# Patient Record
Sex: Female | Born: 1990 | Race: White | Hispanic: No | Marital: Single | Smoking: Never smoker
Health system: Southern US, Community
[De-identification: ages and names within clinical notes are randomized; demographics above are authoritative.]

## PROBLEM LIST (undated history)

## (undated) ENCOUNTER — Inpatient Hospital Stay (HOSPITAL_COMMUNITY): Payer: Self-pay

## (undated) DIAGNOSIS — R87619 Unspecified abnormal cytological findings in specimens from cervix uteri: Secondary | ICD-10-CM

## (undated) DIAGNOSIS — IMO0002 Reserved for concepts with insufficient information to code with codable children: Secondary | ICD-10-CM

## (undated) DIAGNOSIS — B999 Unspecified infectious disease: Secondary | ICD-10-CM

## (undated) DIAGNOSIS — A630 Anogenital (venereal) warts: Secondary | ICD-10-CM

## (undated) DIAGNOSIS — Z8619 Personal history of other infectious and parasitic diseases: Secondary | ICD-10-CM

## (undated) HISTORY — DX: Unspecified abnormal cytological findings in specimens from cervix uteri: R87.619

## (undated) HISTORY — DX: Personal history of other infectious and parasitic diseases: Z86.19

## (undated) HISTORY — PX: OTHER SURGICAL HISTORY: SHX169

## (undated) HISTORY — DX: Reserved for concepts with insufficient information to code with codable children: IMO0002

## (undated) HISTORY — DX: Anogenital (venereal) warts: A63.0

---

## 2012-02-09 ENCOUNTER — Inpatient Hospital Stay (HOSPITAL_COMMUNITY)
Admission: AD | Admit: 2012-02-09 | Discharge: 2012-02-10 | Disposition: A | Discharge status: Home / Self Care | Payer: 59 | Source: Ambulatory Visit | Attending: Obstetrics & Gynecology | Admitting: Obstetrics & Gynecology

## 2012-02-09 ENCOUNTER — Ambulatory Visit (INDEPENDENT_AMBULATORY_CARE_PROVIDER_SITE_OTHER): Payer: 59 | Admitting: Emergency Medicine

## 2012-02-09 ENCOUNTER — Inpatient Hospital Stay (HOSPITAL_COMMUNITY): Payer: 59

## 2012-02-09 ENCOUNTER — Encounter (HOSPITAL_COMMUNITY): Payer: Self-pay | Admitting: *Deleted

## 2012-02-09 VITALS — BP 115/75 | HR 88 | Temp 98.0°F | Resp 16 | Ht 62.0 in | Wt 115.8 lb

## 2012-02-09 DIAGNOSIS — R109 Unspecified abdominal pain: Secondary | ICD-10-CM

## 2012-02-09 DIAGNOSIS — O26899 Other specified pregnancy related conditions, unspecified trimester: Secondary | ICD-10-CM

## 2012-02-09 DIAGNOSIS — O99891 Other specified diseases and conditions complicating pregnancy: Secondary | ICD-10-CM | POA: Insufficient documentation

## 2012-02-09 DIAGNOSIS — N949 Unspecified condition associated with female genital organs and menstrual cycle: Secondary | ICD-10-CM

## 2012-02-09 DIAGNOSIS — O9989 Other specified diseases and conditions complicating pregnancy, childbirth and the puerperium: Secondary | ICD-10-CM

## 2012-02-09 LAB — WET PREP, GENITAL: Trich, Wet Prep: NONE SEEN

## 2012-02-09 LAB — CBC
MCV: 90.8 fL (ref 78.0–100.0)
Platelets: 170 10*3/uL (ref 150–400)
RDW: 13.1 % (ref 11.5–15.5)
WBC: 5.5 10*3/uL (ref 4.0–10.5)

## 2012-02-09 LAB — POCT URINALYSIS DIPSTICK
Blood, UA: NEGATIVE
Leukocytes, UA: NEGATIVE
Spec Grav, UA: 1.02
pH, UA: 7.5

## 2012-02-09 LAB — POCT UA - MICROSCOPIC ONLY
Bacteria, U Microscopic: NEGATIVE
Casts, Ur, LPF, POC: NEGATIVE
Crystals, Ur, HPF, POC: NEGATIVE

## 2012-02-09 LAB — ABO/RH: ABO/RH(D): AB POS

## 2012-02-09 MED ORDER — CONCEPT OB 130-92.4-1 MG PO CAPS: 1.0000 | ORAL_CAPSULE | Freq: Every day | ORAL | Status: DC

## 2012-02-09 NOTE — Patient Instructions (Addendum)
Driving directions to [facility] 3D2D  [phone]  - more info    [address]  [place], [place] [address]     1. Head south on [address] toward [address]      0.5 mi    2. Sharp left onto [address]      0.6 mi    3. Turn left onto the [address] ramp      0.2 mi    4. Merge onto [address]      3.0 mi    5. Continue straight to stay on [address]      0.4 mi    6. Slight left to stay on [address]      1.2 mi    7. Turn right onto [address]      0.1 mi    8. Turn left onto [address]      361 ft    9. Take the 1st left onto [address]  Destination will be on the right    Driving directions to [facility] 3D2D  [phone]  - more info    [address]  [place], [place] [address]     1. Head north on [address] toward [address]      344 ft    2. Turn right onto [address]      0.3 mi    3. Slight left to stay on [address]      1.7 mi    4. Turn left onto [address]  Destination will be on the right     0.6 mi      Hospital  [address]   Driving directions to [address], [place], [place] [address] 3D2D  - more info    [address]  [place], [place] [address]     1. Head south on [address] toward [address]      0.5 mi    2. Sharp left onto [address]      0.6 mi    3. Turn left onto the [address] ramp      0.2 mi    4. Merge onto [address]      3.0 mi    5. Continue straight to stay on [address]      0.4 mi    6. Slight left to stay on [address]  Destination will be on the right     1.0 mi     [address]  [place], [place] [address]   Driving directions to [facility] 3D2D  [phone]  - more info    [address]  [place], [place] [address]     1. Head south on [address] toward [address]      0.5 mi    2. Sharp left onto [address]      0.6 mi    3. Turn left onto the [address] ramp      0.2 mi    4. Merge onto  [address]      3.0 mi    5. Slight right toward [address] (signs for [address])      0.2 mi    6. Take the ramp to [address]      338 ft    7. Turn left onto [address]      0.3 mi    8. Turn left onto [address]  Destination will be on the right     0.2 mi     [facility]  [address]   Driving directions to [facility] 3D2D  - more info    [address]  [place], [place] [address]     1. Head south on [address] toward [address]      0.7 mi    2. Turn left onto [address]      0.4 mi    3. Take the 3rd right onto [address]      1.1 mi    4. Take the [address] ramp to [place]      0.2 mi    5. Merge onto [address]      3.7 mi    6. Take [address] toward [place]      0.3 mi    7. Keep left at the fork, follow signs for [address]      381 ft    8. Keep left at the fork, follow signs for [address]      302 ft    9. Turn left onto [address]      2.6 mi    10. Turn right onto [address]  Destination will be on the left     0.2 mi     [facility]    

## 2012-02-09 NOTE — MAU Note (Signed)
I'm pregnant and been having cramping last 4-5 days. Seen at Urgent Care couple hrs ago and found out I'm pregnant.  Told to come here for u/s to be sure is not ectopic. No spotting and no pain now.

## 2012-02-09 NOTE — MAU Provider Note (Signed)
Chief Complaint: Vaginal Bleeding   First Provider Initiated Contact with Patient 02/09/12 2151     SUBJECTIVE HPI: Debbie Soto is a 21 y.o. G1P0 at3.5 weeks by uncertain, early LMP who presents with abd cramping x 4-5 days. She was seen at Urgent Care this afternoon and found to have pos UPT. Instructed to come to MAU or ED to R/O ectopic or SAB. Denies bleeding, urinary complaints, GI complaints, fever, chills, dyspareunia or vaginal discharge. Last bowel movement yesterday. Normal UA at urgent care. Pelvic exam not done due to patient preference for female provider.  No past medical history on file. OB History    Grav Para Term Preterm Abortions TAB SAB Ect Mult Living   1              # Outc Date GA Lbr Len/2nd Wgt Sex Del Anes PTL Lv   1 CUR              No past surgical history on file. History   Social History  . Marital Status: Single    Spouse Name: N/A    Number of Children: N/A  . Years of Education: N/A   Occupational History  . Not on file.   Social History Main Topics  . Smoking status: Current Some Day Smoker  . Smokeless tobacco: Not on file  . Alcohol Use: Not on file  . Drug Use: Not on file  . Sexually Active: Yes   Other Topics Concern  . Not on file   Social History Narrative  . No narrative on file   No current facility-administered medications on file prior to encounter.   No current outpatient prescriptions on file prior to encounter.   No Known Allergies  ROS: Pertinent items in HPI  OBJECTIVE Blood pressure 120/79, pulse 117, temperature 98.1 F (36.7 C), resp. rate 20, height 5\' 2"  (1.575 m), weight 52.164 kg (115 lb), last menstrual period 01/14/2012, SpO2 100.00%. Patient Vitals for the past 24 hrs:  BP Temp Pulse Resp SpO2 Height Weight  02/09/12 2335 119/69 mmHg - 107  16  - - -  02/09/12 2127 120/79 mmHg 98.1 F (36.7 C) 117  20  100 % 5\' 2"  (1.575 m) 52.164 kg (115 lb)    GENERAL: Well-developed, well-nourished female in  no acute distress. Very anxious. HEENT: Normocephalic HEART: tachycardia RESP: normal effort ABDOMEN: Soft, mild bilat mid-abd tenderness EXTREMITIES: Nontender, no edema NEURO: Alert and oriented SPECULUM EXAM: NEFG, physiologic discharge, , normal odor, no blood noted, cervix clean, non-friable BIMANUAL: cervix closed; uterus, retroverted, normal size, no adnexal tenderness or masses, no CMT  LAB RESULTS Results for orders placed during the hospital encounter of 02/09/12 (from the past 24 hour(s))  POCT PREGNANCY, URINE     Status: Abnormal   Collection Time   02/09/12  9:27 PM      Component Value Range   Preg Test, Ur POSITIVE (*) NEGATIVE  WET PREP, GENITAL     Status: Abnormal   Collection Time   02/09/12 10:08 PM      Component Value Range   Yeast Wet Prep HPF POC NONE SEEN  NONE SEEN   Trich, Wet Prep NONE SEEN  NONE SEEN   Clue Cells Wet Prep HPF POC FEW (*) NONE SEEN   WBC, Wet Prep HPF POC FEW (*) NONE SEEN  CBC     Status: Abnormal   Collection Time   02/09/12 10:15 PM      Component Value Range  WBC 5.5  4.0 - 10.5 K/uL   RBC 3.93  3.87 - 5.11 MIL/uL   Hemoglobin 11.9 (*) 12.0 - 15.0 g/dL   HCT 16.1 (*) 09.6 - 04.5 %   MCV 90.8  78.0 - 100.0 fL   MCH 30.3  26.0 - 34.0 pg   MCHC 33.3  30.0 - 36.0 g/dL   RDW 40.9  81.1 - 91.4 %   Platelets 170  150 - 400 K/uL  HCG, QUANTITATIVE, PREGNANCY     Status: Abnormal   Collection Time   02/09/12 10:16 PM      Component Value Range   hCG, Beta Chain, Quant, S 4443 (*) <5 mIU/mL  ABO/RH     Status: Normal (Preliminary result)   Collection Time   02/09/12 10:16 PM      Component Value Range   ABO/RH(D) AB POS      IMAGING US Ob Comp Less 14 Wks  02/09/2012  *RADIOLOGY REPORT*  Clinical Data: Abdominal pain and cramping.  OBSTETRIC <14 WK [place] AND TRANSVAGINAL OB US  Technique:  Both transabdominal and transvaginal ultrasound examinations were performed for complete evaluation of the gestation as well as the maternal  uterus, adnexal regions, and pelvic cul-de-sac.  Transvaginal technique was performed to assess early pregnancy.  Comparison:  None.  Intrauterine gestational sac:  Visualized/normal in shape. Yolk sac: No Embryo: No Cardiac Activity: N/A  MSD: 5.4 mm  5 w 1 d          [place] EDC: 10/10/2012  Maternal uterus/adnexae: No subchorionic hemorrhage is noted.  The uterus is unremarkable in appearance.  The ovaries are within normal limits.  The right ovary measures 3.0 x 1.8 x 2.1 cm, while the left ovary measures 2.9 x 2.1 x 2.7 cm. No suspicious adnexal masses are seen; there is no evidence for ovarian torsion.  No free fluid is seen within the pelvic cul-de-sac.  IMPRESSION: Single intrauterine gestational sac noted, with a mean sac diameter of 5.4 mm, corresponding to a gestational age of [redacted] weeks 1 day. This does not match the gestational age by LMP, reflecting a new estimated date of delivery of October 10, 2012.   Original Report Authenticated By: Tonia Ghent, M.D.    US Ob Transvaginal  02/09/2012  *RADIOLOGY REPORT*  Clinical Data: Abdominal pain and cramping.  OBSTETRIC <14 WK [place] AND TRANSVAGINAL OB US  Technique:  Both transabdominal and transvaginal ultrasound examinations were performed for complete evaluation of the gestation as well as the maternal uterus, adnexal regions, and pelvic cul-de-sac.  Transvaginal technique was performed to assess early pregnancy.  Comparison:  None.  Intrauterine gestational sac:  Visualized/normal in shape. Yolk sac: No Embryo: No Cardiac Activity: N/A  MSD: 5.4 mm  5 w 1 d          [place] EDC: 10/10/2012  Maternal uterus/adnexae: No subchorionic hemorrhage is noted.  The uterus is unremarkable in appearance.  The ovaries are within normal limits.  The right ovary measures 3.0 x 1.8 x 2.1 cm, while the left ovary measures 2.9 x 2.1 x 2.7 cm. No suspicious adnexal masses are seen; there is no evidence for ovarian torsion.  No free fluid is seen within the pelvic cul-de-sac.   IMPRESSION: Single intrauterine gestational sac noted, with a mean sac diameter of 5.4 mm, corresponding to a gestational age of [redacted] weeks 1 day. This does not match the gestational age by LMP, reflecting a new estimated date of delivery of October 10, 2012.  Original Report Authenticated By: Tonia Ghent, M.D.    MAU COURSE  ASSESSMENT 1. Abdominal pain complicating pregnancy, antepartum, likely due to bloating   Early pregnancy, IUP not confirmed   PLAN Discharge home SAB and ectopic precautions Plan viability [place] in 10 days.     Follow-up Information    Follow up with THE [facility] OF [place] MATERNITY ADMISSIONS. On 02/12/2012. (for bloodwork or  as needed if symptoms worsen)    Contact information:   [address] [id number] mc [place] [address] [phone]          Medication List    Notice       You have not been prescribed any medications.         [place], CNM 02/09/2012  11:32 PM

## 2012-02-09 NOTE — Progress Notes (Signed)
[facility] [address] [place] [address] [phone]  Date:  02/09/2012   Name:  Debbie Soto   DOB:  07-13-1990   MRN:  [id number]  PCP:  No primary provider on file.    Chief Complaint: Abdominal Pain   History of Present Illness:  Debbie Soto is a 21 y.o. very pleasant female patient who presents with the following:  5 day history of intermittent abdominal cramping.  Sexually active and relies on condoms.  Intercourse twice in October.  LMP 3 weeks ago.  No dysuria, urgency or frequency.  No discharge or vaginal bleeding.  No fever or chills. No diarrhea or other complaints.  Nulligravida.  There is no problem list on file for this patient.   No past medical history on file.  No past surgical history on file.  History  Substance Use Topics  . Smoking status: Current Some Day Smoker  . Smokeless tobacco: Not on file  . Alcohol Use: Not on file    No family history on file.  No Known Allergies  Medication list has been reviewed and updated.  No current outpatient prescriptions on file prior to visit.    Review of Systems:  As per HPI, otherwise negative.    Physical Examination: Filed Vitals:   02/09/12 1824  BP: 115/75  Pulse: 88  Temp: 98 F (36.7 C)  Resp: 16   Filed Vitals:   02/09/12 1824  Height: 5\' 2"  (1.575 m)  Weight: 115 lb 12.8 oz (52.527 kg)   Body mass index is 21.18 kg/(m^2). Ideal Body Weight: Weight in (lb) to have BMI = 25: 136.4   GEN: WDWN, NAD, Non-toxic, A & O x 3 HEENT: Atraumatic, Normocephalic. Neck supple. No masses, No LAD. Ears and Nose: No external deformity. CV: RRR, No M/G/R. No JVD. No thrill. No extra heart sounds. PULM: CTA B, no wheezes, crackles, rhonchi. No retractions. No resp. distress. No accessory muscle use. ABD: S, NT, ND, +BS. No rebound. No HSM. EXTR: No c/c/e NEURO Normal gait.  PSYCH: Normally interactive. Conversant. Not depressed or anxious appearing.  Calm  demeanor.    Assessment and Plan: Pelvic pain Pregnancy Refer to ER for ultrasound  Refused pelvic  Carmelina Dane, MD  Results for orders placed in visit on 02/09/12  POCT UA - MICROSCOPIC ONLY      Component Value Range   WBC, Ur, HPF, POC neg     RBC, urine, microscopic 0-1     Bacteria, U Microscopic neg     Mucus, UA neg     Epithelial cells, urine per micros 0-1     Crystals, Ur, HPF, POC neg     Casts, Ur, LPF, POC neg     Yeast, UA neg     Amorphous large    POCT URINALYSIS DIPSTICK      Component Value Range   Color, UA yellow     Clarity, UA cloudy     Glucose, UA neg     Bilirubin, UA neg     Ketones, UA trace     Spec Grav, UA 1.020     Blood, UA neg     pH, UA 7.5     Protein, UA 30mg      Urobilinogen, UA 1.0     Nitrite, UA neg     Leukocytes, UA Negative    POCT URINE PREGNANCY      Component Value Range   Preg Test, Ur Positive

## 2012-02-10 LAB — GC/CHLAMYDIA PROBE AMP, GENITAL: Chlamydia, DNA Probe: NEGATIVE

## 2012-02-12 ENCOUNTER — Inpatient Hospital Stay (HOSPITAL_COMMUNITY)
Admission: AD | Admit: 2012-02-12 | Discharge: 2012-02-12 | Disposition: A | Discharge status: Home / Self Care | Payer: 59 | Source: Ambulatory Visit | Attending: Obstetrics and Gynecology | Admitting: Obstetrics and Gynecology

## 2012-02-12 ENCOUNTER — Encounter (HOSPITAL_COMMUNITY): Payer: Self-pay | Admitting: *Deleted

## 2012-02-12 DIAGNOSIS — R109 Unspecified abdominal pain: Secondary | ICD-10-CM | POA: Insufficient documentation

## 2012-02-12 DIAGNOSIS — O99891 Other specified diseases and conditions complicating pregnancy: Secondary | ICD-10-CM | POA: Insufficient documentation

## 2012-02-12 DIAGNOSIS — O26899 Other specified pregnancy related conditions, unspecified trimester: Secondary | ICD-10-CM

## 2012-02-12 HISTORY — DX: Unspecified infectious disease: B99.9

## 2012-02-12 LAB — HCG, QUANTITATIVE, PREGNANCY: hCG, Beta Chain, Quant, S: 8198 m[IU]/mL — ABNORMAL HIGH (ref <5)

## 2012-02-12 NOTE — MAU Provider Note (Signed)
  History     CSN: [id number]  Arrival date and time: 02/12/12 0834   None     Chief Complaint  Patient presents with  . Follow-up   HPI 21 y.o. G1P0 at [redacted]w[redacted]d here for f/u quant, on 11/8 had u/s showing 5 week size IUGS with no yolk sac or fetal pole, quant HCG 4400 at that time.    Past Medical History  Diagnosis Date  . Infection     UTI    Past Surgical History  Procedure Date  . No past surgeries     Family History  Problem Relation Age of Onset  . Other Neg Hx     History  Substance Use Topics  . Smoking status: Current Some Day Smoker  . Smokeless tobacco: Never Used     Comment: never a daily or longterm thing  . Alcohol Use: Yes     Comment: occ    Allergies: No Known Allergies  No prescriptions prior to admission    Review of Systems  Constitutional: Negative.   Respiratory: Negative.   Cardiovascular: Negative.   Gastrointestinal: Positive for abdominal pain (occasional). Negative for nausea, vomiting, diarrhea and constipation.  Genitourinary: Negative for dysuria, urgency, frequency, hematuria and flank pain.       Negative for vaginal bleeding, vaginal discharge, dyspareunia  Musculoskeletal: Negative.   Neurological: Negative.   Psychiatric/Behavioral: Negative.    Physical Exam   Blood pressure 113/73, pulse 93, temperature 97.9 F (36.6 C), temperature source Oral, resp. rate 18, last menstrual period 01/14/2012.  Physical Exam  Nursing note and vitals reviewed. Constitutional: She is oriented to person, place, and time. She appears well-developed and well-nourished. No distress.  Cardiovascular: Normal rate.   Respiratory: Effort normal.  GI: Soft. There is no tenderness.  Musculoskeletal: Normal range of motion.  Neurological: She is alert and oriented to person, place, and time.  Skin: Skin is warm and dry.  Psychiatric: She has a normal mood and affect.    MAU Course  Procedures Results for orders placed during the  hospital encounter of 02/12/12 (from the past 72 hour(s))  HCG, QUANTITATIVE, PREGNANCY     Status: Abnormal   Collection Time   02/12/12  9:55 AM      Component Value Range Comment   hCG, Beta Chain, Quant, S 8198 (*) <5 mIU/mL     Assessment and Plan   1. Abdominal pain in pregnancy   Quant slightly less than doubled in 48 hours, will repeat u/s in 1 week, explained to patient that this pregnancy could be a normal pregnancy or could still be an ectopic or miscarriage, rev'd precautions    Medication List     As of 02/12/2012  5:36 PM          Follow-up Information    Follow up with THE [facility] OF [place] ULTRASOUND. On 02/19/2012. (8:30 AM)    Contact information:   [address] [id number] mc [place] [address] [phone]      Follow up with THE [facility] OF [place] MATERNITY ADMISSIONS. On 02/19/2012. (following ultrasound)    Contact information:   [address] [id number] mc [place] [address] [phone]           Cystal Shannahan 02/12/2012, 11:30 AM

## 2012-02-12 NOTE — MAU Note (Signed)
NFrazier, CNM notified pt lab results returned, will speak with pt shortly.

## 2012-02-12 NOTE — MAU Note (Signed)
Became dizzy during blood draw, now complaining of pain.  Taken to rm via wc.

## 2012-02-12 NOTE — MAU Note (Signed)
Labs not drawn yet, discovered, orders not crossing to lab. Reported to IT.

## 2012-02-12 NOTE — MAU Note (Signed)
Feeling ok, some nausea at times, occ cramping.

## 2012-02-14 NOTE — MAU Provider Note (Signed)
Attestation of Attending Supervision of Advanced Practitioner (CNM/NP): Evaluation and management procedures were performed by the Advanced Practitioner under my supervision and collaboration.  I have reviewed the Advanced Practitioner's note and chart, and I agree with the management and plan.  Patryk Conant 02/14/2012 10:35 AM   

## 2012-02-17 NOTE — Progress Notes (Signed)
Reviewed and agree.

## 2012-02-19 ENCOUNTER — Inpatient Hospital Stay (HOSPITAL_COMMUNITY)
Admission: AD | Admit: 2012-02-19 | Discharge: 2012-02-19 | Disposition: A | Discharge status: Home / Self Care | Payer: 59 | Source: Ambulatory Visit | Attending: Obstetrics and Gynecology | Admitting: Obstetrics and Gynecology

## 2012-02-19 ENCOUNTER — Ambulatory Visit (HOSPITAL_COMMUNITY)
Admit: 2012-02-19 | Discharge: 2012-02-19 | Disposition: A | Discharge status: Home / Self Care | Payer: 59 | Attending: Obstetrics & Gynecology | Admitting: Obstetrics & Gynecology

## 2012-02-19 DIAGNOSIS — R109 Unspecified abdominal pain: Secondary | ICD-10-CM | POA: Insufficient documentation

## 2012-02-19 DIAGNOSIS — O99891 Other specified diseases and conditions complicating pregnancy: Secondary | ICD-10-CM | POA: Insufficient documentation

## 2012-02-19 DIAGNOSIS — Z3689 Encounter for other specified antenatal screening: Secondary | ICD-10-CM | POA: Insufficient documentation

## 2012-02-19 DIAGNOSIS — O26899 Other specified pregnancy related conditions, unspecified trimester: Secondary | ICD-10-CM

## 2012-02-19 DIAGNOSIS — Z3490 Encounter for supervision of normal pregnancy, unspecified, unspecified trimester: Secondary | ICD-10-CM

## 2012-02-19 DIAGNOSIS — O3680X0 Pregnancy with inconclusive fetal viability, not applicable or unspecified: Secondary | ICD-10-CM | POA: Insufficient documentation

## 2012-02-19 NOTE — MAU Provider Note (Signed)
Attestation of Attending Supervision of Advanced Practitioner (CNM/NP): Evaluation and management procedures were performed by the Advanced Practitioner under my supervision and collaboration.  I have reviewed the Advanced Practitioner's note and chart, and I agree with the management and plan.  HARRAWAY-SMITH, Sade Hollon 11:39 PM     

## 2012-02-19 NOTE — MAU Provider Note (Signed)
  History     CSN: [id number]  Arrival date and time: 02/19/12 0857   None     Chief Complaint  Patient presents with  . Follow-up   HPI 21 y.o. G1P0 here for f/u ultrasound for viability, denies bleeding, ongoing low abd pain.   Past Medical History  Diagnosis Date  . Infection     UTI    Past Surgical History  Procedure Date  . No past surgeries     Family History  Problem Relation Age of Onset  . Other Neg Hx     History  Substance Use Topics  . Smoking status: Current Some Day Smoker  . Smokeless tobacco: Never Used     Comment: never a daily or longterm thing  . Alcohol Use: Yes     Comment: occ    Allergies: No Known Allergies  No prescriptions prior to admission    Review of Systems  Constitutional: Negative.   Gastrointestinal: Negative.   Genitourinary: Negative.    Physical Exam   Blood pressure 124/78, pulse 94, temperature 98 F (36.7 C), temperature source Oral, resp. rate 16, last menstrual period 01/14/2012, SpO2 100.00%.  Physical Exam  Nursing note and vitals reviewed. Constitutional: She is oriented to person, place, and time. She appears well-developed and well-nourished. No distress.  Cardiovascular: Normal rate.   Respiratory: Effort normal.  Musculoskeletal: Normal range of motion.  Neurological: She is alert and oriented to person, place, and time.  Skin: Skin is warm and dry.  Psychiatric: She has a normal mood and affect.    MAU Course  Procedures U/S: 6.4 week IUP with + FHR  Assessment and Plan   1. Normal IUP (intrauterine pregnancy) on prenatal ultrasound       Medication List    Notice       You have not been prescribed any medications.          Follow-up Information    Follow up with prenatal care provider of your choice. (as soon as possible for prenatal care)            Marnee Sherrard 02/19/2012, 4:42 PM

## 2012-02-19 NOTE — MAU Note (Signed)
Patient to MAU for follow up after ultrasound. States she has been having more cramping for the past three days. Patient denies any bleeding or discharge. Has some nausea, no vomiting.

## 2012-04-03 NOTE — L&D Delivery Note (Signed)
Operative Delivery Note Pt labored down and began pushing at 330pm. The baby had a long deceleration to 80-90's which took about 5 minutes to recover.  After that,  the baby continued to have deep variable decels with every contraction and the vertex was a +2 station ROA.  The patient and family were counseled that I felt we should expedite delivery with vacuum assistance.  The Kiwi vacuum was applied and inflated to the green zone ROA and +2 station.  Over several contractions, the vertex was brought to crowning with gentle traction. At 4:30 PM a healthy female was delivered via Vaginal, Vacuum Investment [employer], operational).  Presentation: vertex; Position: Right,, Occiput,, Anterior; Station: +2.  Verbal consent: obtained from patient.  Risks and benefits discussed in detail.  Risks include, but are not limited to the risks of anesthesia, bleeding, infection, damage to maternal tissues, fetal cephalhematoma.  There is also the risk of inability to effect vaginal delivery of the head, or shoulder dystocia that cannot be resolved by established maneuvers, leading to the need for emergency cesarean section.  APGAR: 8, 8; weight 7 lb 13.8 oz (3565 g).   Placenta status: Intact, Spontaneous.   Cord: 3 vessels with the following complications: none  Anesthesia: Epidural  Instruments:Kiwi Episiotomy: None Lacerations: 2nd degree;Sulcus Suture Repair: 2.0 3.0 vicryl Est. Blood Loss (mL): 200cc  Mom to postpartum.  Baby to stay with mother.  [name] 10/10/2012, 5:55 PM

## 2012-05-18 ENCOUNTER — Other Ambulatory Visit: Payer: Self-pay

## 2012-07-12 LAB — OB RESULTS CONSOLE GC/CHLAMYDIA: Chlamydia: NEGATIVE

## 2012-07-12 LAB — OB RESULTS CONSOLE HEPATITIS B SURFACE ANTIGEN: Hepatitis B Surface Ag: NEGATIVE

## 2012-07-12 LAB — OB RESULTS CONSOLE RPR: RPR: NONREACTIVE

## 2012-10-02 ENCOUNTER — Encounter (HOSPITAL_COMMUNITY): Payer: Self-pay | Admitting: *Deleted

## 2012-10-02 ENCOUNTER — Telehealth (HOSPITAL_COMMUNITY): Payer: Self-pay | Admitting: *Deleted

## 2012-10-02 LAB — OB RESULTS CONSOLE GBS: GBS: NEGATIVE

## 2012-10-02 NOTE — Telephone Encounter (Signed)
Preadmission screen  

## 2012-10-10 ENCOUNTER — Encounter (HOSPITAL_COMMUNITY): Payer: Self-pay

## 2012-10-10 ENCOUNTER — Encounter (HOSPITAL_COMMUNITY): Payer: Self-pay | Admitting: Anesthesiology

## 2012-10-10 ENCOUNTER — Inpatient Hospital Stay (HOSPITAL_COMMUNITY): Payer: Medicaid Other | Admitting: Anesthesiology

## 2012-10-10 ENCOUNTER — Inpatient Hospital Stay (HOSPITAL_COMMUNITY)
Admission: AD | Admit: 2012-10-10 | Discharge: 2012-10-12 | DRG: 775 | Disposition: A | Discharge status: Home / Self Care | Payer: Medicaid Other | Source: Ambulatory Visit | Attending: Obstetrics and Gynecology | Admitting: Obstetrics and Gynecology

## 2012-10-10 DIAGNOSIS — D649 Anemia, unspecified: Secondary | ICD-10-CM | POA: Diagnosis not present

## 2012-10-10 DIAGNOSIS — O9903 Anemia complicating the puerperium: Secondary | ICD-10-CM | POA: Diagnosis not present

## 2012-10-10 LAB — CBC
Hemoglobin: 9.8 g/dL — ABNORMAL LOW (ref 12.0–15.0)
Hemoglobin: 8.7 g/dL — ABNORMAL LOW (ref 12.0–15.0)
MCH: 26.7 pg (ref 26.0–34.0)
MCH: 27 pg (ref 26.0–34.0)
MCHC: 32.1 g/dL (ref 30.0–36.0)
MCV: 83.1 fL (ref 78.0–100.0)
Platelets: 95 10*3/uL — ABNORMAL LOW (ref 150–400)
RBC: 3.67 MIL/uL — ABNORMAL LOW (ref 3.87–5.11)
RDW: 13.8 % (ref 11.5–15.5)
WBC: 8.6 10*3/uL (ref 4.0–10.5)

## 2012-10-10 LAB — RPR: RPR Ser Ql: NONREACTIVE

## 2012-10-10 MED ORDER — IBUPROFEN 600 MG PO TABS
600.0000 mg | ORAL_TABLET | Freq: Four times a day (QID) | ORAL | Status: DC
  Administered 2012-10-11 – 2012-10-12 (×6): 600 mg via ORAL
  Filled 2012-10-10 (×7): qty 1

## 2012-10-10 MED ORDER — SENNOSIDES-DOCUSATE SODIUM 8.6-50 MG PO TABS
2.0000 | ORAL_TABLET | Freq: Every day | ORAL | Status: DC
  Administered 2012-10-11: 2 via ORAL

## 2012-10-10 MED ORDER — PHENYLEPHRINE 40 MCG/ML (10ML) SYRINGE FOR IV PUSH (FOR BLOOD PRESSURE SUPPORT)
80.0000 ug | PREFILLED_SYRINGE | INTRAVENOUS | Status: DC | PRN
  Filled 2012-10-10: qty 2

## 2012-10-10 MED ORDER — TETANUS-DIPHTH-ACELL PERTUSSIS 5-2.5-18.5 LF-MCG/0.5 IM SUSP: 0.5000 mL | Freq: Once | INTRAMUSCULAR | Status: DC

## 2012-10-10 MED ORDER — FLEET ENEMA 7-19 GM/118ML RE ENEM: 1.0000 | ENEMA | Freq: Once | RECTAL | Status: DC

## 2012-10-10 MED ORDER — SIMETHICONE 80 MG PO CHEW: 80.0000 mg | CHEWABLE_TABLET | ORAL | Status: DC | PRN

## 2012-10-10 MED ORDER — LACTATED RINGERS IV SOLN: INTRAVENOUS | Status: DC

## 2012-10-10 MED ORDER — OXYCODONE-ACETAMINOPHEN 5-325 MG PO TABS
1.0000 | ORAL_TABLET | ORAL | Status: DC | PRN
  Administered 2012-10-10 – 2012-10-12 (×5): 1 via ORAL
  Filled 2012-10-10 (×5): qty 1

## 2012-10-10 MED ORDER — PHENYLEPHRINE 40 MCG/ML (10ML) SYRINGE FOR IV PUSH (FOR BLOOD PRESSURE SUPPORT)
80.0000 ug | PREFILLED_SYRINGE | INTRAVENOUS | Status: DC | PRN
  Filled 2012-10-10: qty 2
  Filled 2012-10-10: qty 5

## 2012-10-10 MED ORDER — ACETAMINOPHEN 325 MG PO TABS: 650.0000 mg | ORAL_TABLET | ORAL | Status: DC | PRN

## 2012-10-10 MED ORDER — LANOLIN HYDROUS EX OINT: TOPICAL_OINTMENT | CUTANEOUS | Status: DC | PRN

## 2012-10-10 MED ORDER — ONDANSETRON HCL 4 MG/2ML IJ SOLN: 4.0000 mg | INTRAMUSCULAR | Status: DC | PRN

## 2012-10-10 MED ORDER — ONDANSETRON HCL 4 MG/2ML IJ SOLN: 4.0000 mg | Freq: Four times a day (QID) | INTRAMUSCULAR | Status: DC | PRN

## 2012-10-10 MED ORDER — CITRIC ACID-SODIUM CITRATE 334-500 MG/5ML PO SOLN: 30.0000 mL | ORAL | Status: DC | PRN

## 2012-10-10 MED ORDER — LACTATED RINGERS IV SOLN
500.0000 mL | Freq: Once | INTRAVENOUS | Status: AC
  Administered 2012-10-10: 10:00:00 via INTRAVENOUS

## 2012-10-10 MED ORDER — ZOLPIDEM TARTRATE 5 MG PO TABS: 5.0000 mg | ORAL_TABLET | Freq: Every evening | ORAL | Status: DC | PRN

## 2012-10-10 MED ORDER — ONDANSETRON HCL 4 MG PO TABS
4.0000 mg | ORAL_TABLET | ORAL | Status: DC | PRN
  Administered 2012-10-10: 4 mg via ORAL
  Filled 2012-10-10: qty 1

## 2012-10-10 MED ORDER — OXYTOCIN BOLUS FROM INFUSION: 500.0000 mL | INTRAVENOUS | Status: DC

## 2012-10-10 MED ORDER — WITCH HAZEL-GLYCERIN EX PADS: 1.0000 "application " | MEDICATED_PAD | CUTANEOUS | Status: DC | PRN

## 2012-10-10 MED ORDER — LIDOCAINE HCL (PF) 1 % IJ SOLN
INTRAMUSCULAR | Status: DC | PRN
  Administered 2012-10-10: 4 mL
  Administered 2012-10-10 (×3): 2 mL
  Administered 2012-10-10: 4 mL
  Administered 2012-10-10: 2 mL

## 2012-10-10 MED ORDER — OXYTOCIN 40 UNITS IN LACTATED RINGERS INFUSION - SIMPLE MED
62.5000 mL/h | INTRAVENOUS | Status: DC
  Administered 2012-10-10: 62.5 mL/h via INTRAVENOUS
  Filled 2012-10-10: qty 1000

## 2012-10-10 MED ORDER — LACTATED RINGERS IV BOLUS (SEPSIS)
500.0000 mL | Freq: Once | INTRAVENOUS | Status: AC
  Administered 2012-10-10: 500 mL via INTRAVENOUS

## 2012-10-10 MED ORDER — OXYCODONE-ACETAMINOPHEN 5-325 MG PO TABS: 1.0000 | ORAL_TABLET | ORAL | Status: DC | PRN

## 2012-10-10 MED ORDER — PRENATAL MULTIVITAMIN CH
1.0000 | ORAL_TABLET | Freq: Every day | ORAL | Status: DC
  Administered 2012-10-11 – 2012-10-12 (×2): 1 via ORAL
  Filled 2012-10-10 (×2): qty 1

## 2012-10-10 MED ORDER — DIPHENHYDRAMINE HCL 50 MG/ML IJ SOLN: 12.5000 mg | INTRAMUSCULAR | Status: DC | PRN

## 2012-10-10 MED ORDER — LACTATED RINGERS IV SOLN: 500.0000 mL | INTRAVENOUS | Status: DC | PRN

## 2012-10-10 MED ORDER — EPHEDRINE 5 MG/ML INJ
10.0000 mg | INTRAVENOUS | Status: DC | PRN
  Administered 2012-10-10: 10:00:00 via INTRAVENOUS
  Filled 2012-10-10: qty 2

## 2012-10-10 MED ORDER — EPHEDRINE 5 MG/ML INJ
10.0000 mg | INTRAVENOUS | Status: DC | PRN
  Filled 2012-10-10: qty 4
  Filled 2012-10-10: qty 2

## 2012-10-10 MED ORDER — DIPHENHYDRAMINE HCL 25 MG PO CAPS: 25.0000 mg | ORAL_CAPSULE | Freq: Four times a day (QID) | ORAL | Status: DC | PRN

## 2012-10-10 MED ORDER — BENZOCAINE-MENTHOL 20-0.5 % EX AERO
1.0000 "application " | INHALATION_SPRAY | CUTANEOUS | Status: DC | PRN
  Administered 2012-10-12: 1 via TOPICAL
  Filled 2012-10-10: qty 56

## 2012-10-10 MED ORDER — FENTANYL 2.5 MCG/ML BUPIVACAINE 1/10 % EPIDURAL INFUSION (WH - ANES)
14.0000 mL/h | INTRAMUSCULAR | Status: DC | PRN
  Administered 2012-10-10: 14 mL/h via EPIDURAL
  Filled 2012-10-10: qty 125

## 2012-10-10 MED ORDER — DIBUCAINE 1 % RE OINT: 1.0000 "application " | TOPICAL_OINTMENT | RECTAL | Status: DC | PRN

## 2012-10-10 MED ORDER — LIDOCAINE HCL (PF) 1 % IJ SOLN
30.0000 mL | INTRAMUSCULAR | Status: DC | PRN
  Filled 2012-10-10: qty 30

## 2012-10-10 MED ORDER — IBUPROFEN 600 MG PO TABS
600.0000 mg | ORAL_TABLET | Freq: Four times a day (QID) | ORAL | Status: DC | PRN
  Administered 2012-10-10: 600 mg via ORAL
  Filled 2012-10-10: qty 1

## 2012-10-10 NOTE — Progress Notes (Signed)
   Subjective: Pt more comfortable but she feels some pressure  Objective: BP 118/68  Pulse 108  Temp(Src) 98.7 F (37.1 C) (Oral)  Resp 16  Ht 5\' 2"  (1.575 m)  Wt 62.596 kg (138 lb)  BMI 25.23 kg/m2  SpO2 100%  LMP 01/14/2012      FHT:  FHR: 145 bpm, variability: moderate,  accelerations:  Present,  decelerations:  Absent UC:   regular, every 2-3 minutes SVE:   Dilation: 10 Effacement (%): 100 Station: +1 Exam by:: Cher Egnor  Labs: Lab Results  Component Value Date   WBC 8.6 10/10/2012   HGB 9.8* 10/10/2012   HCT 30.5* 10/10/2012   MCV 83.1 10/10/2012   PLT 121* 10/10/2012    Assessment / Plan: Pt complete but station still 0-+1 and baby OP.  Will allow to labor down and see if baby will rotate. [name] 10/10/2012, 1:37 PM

## 2012-10-10 NOTE — Anesthesia Procedure Notes (Signed)
Epidural Patient location during procedure: OB Start time: 10/10/2012 10:12 AM  Staffing Performed by: anesthesiologist   Preanesthetic Checklist Completed: patient identified, site marked, surgical consent, pre-op evaluation, timeout performed, IV checked, risks and benefits discussed and monitors and equipment checked  Epidural Patient position: sitting Prep: site prepped and draped and DuraPrep Patient monitoring: continuous pulse ox and blood pressure Approach: midline Injection technique: LOR air  Needle:  Needle type: Tuohy  Needle gauge: 17 G Needle length: 9 cm and 9 Needle insertion depth: 4 cm Catheter type: closed end flexible Catheter size: 19 Gauge Catheter at skin depth: 9 cm Test dose: negative  Assessment Events: blood not aspirated, injection not painful, no injection resistance, negative IV test and no paresthesia  Additional Notes Discussed risk of headache, infection, bleeding, nerve injury and failed or incomplete block.  Patient voices understanding and wishes to proceed.  Epidural placed easily on first attempt.  No paresthesia.  Patient tolerated procedure well with no apparent complications.  Jasmine December, MD Reason for block:procedure for pain

## 2012-10-10 NOTE — Progress Notes (Signed)
When patient walked to bathroom at 2200 by this RN, patient stated that she was nauseous and felt funny.  Patient then started to pass out on commode with this RN present.  This RN held patient up and secured airway, called for help and ammonia, used ammonia when brought to patient's room, transferred patient into a wheelchair with the help of [employer] RN, transferred patient to bed, started oxygen, monitored VS, called MD and received orders.  Patient currently stable and this RN will continue to monitor.

## 2012-10-10 NOTE — H&P (Signed)
Debbie Soto is a 22 y.o. female G1P0 at 40 weeks (EDD 10/10/12 by 10 week [place]) presenting for SROM at 715 am followed by onset of contractions.  Prenatal care started very late at 27 weeks.  UIneventful except a positive chlamydia test 4/14 with treatment and negative TOC 5/14.  She had an ASCUS pap with +HR HPV as well.  Maternal Medical History:  Reason for admission: Rupture of membranes.   Contractions: Onset was 3-5 hours ago.   Frequency: regular.   Perceived severity is strong.    Fetal activity: Perceived fetal activity is normal.    Prenatal Complications - Diabetes: none.    OB History   Grav Para Term Preterm Abortions TAB SAB Ect Mult Living   1              Past Medical History  Diagnosis Date  . Infection     UTI  . Hx of varicella   . Abnormal Pap smear   . HPV (human papilloma virus) anogenital infection    Past Surgical History  Procedure Laterality Date  . Arm surgery      right   Family History: family history includes Cancer in her maternal grandmother.  There is no history of Other, and Alcohol abuse, and Arthritis, and Asthma, and Birth defects, and COPD, and Drug abuse, and Diabetes, and Depression, and Early death, and Hearing loss, and Heart disease, and Hyperlipidemia, and Hypertension, and Kidney disease, and Learning disabilities, and Mental illness, and Mental retardation, and Miscarriages / Stillbirths, and Stroke, and Vision loss, . Social History:  reports that she has never smoked. She has never used smokeless tobacco. She reports that she does not drink alcohol or use illicit drugs.   Prenatal Transfer Tool  Maternal Diabetes: No Genetic Screening: too late to care Maternal Ultrasounds/Referrals: Normal Fetal Ultrasounds or other Referrals:  None Maternal Substance Abuse:  No Significant Maternal Medications:  None Significant Maternal Lab Results:  Lab values include: Other: +chlamydia with treatment and negative TOC 5/14 Other  Comments:  None  ROS  Dilation: 3 Effacement (%): 80 Station: -1 Exam by:: SBeck, RN Blood pressure 114/89, pulse 98, temperature 98.6 F (37 C), temperature source Oral, resp. rate 20, height 5\' 2"  (1.575 m), weight 62.596 kg (138 lb), last menstrual period 01/14/2012, SpO2 92.00%. Maternal Exam:  Uterine Assessment: Contraction strength is firm.  Contraction frequency is regular.   Abdomen: Patient reports no abdominal tenderness. Fetal presentation: vertex  Introitus: Normal vulva. Normal vagina.  Amniotic fluid character: clear.  Pelvis: adequate for delivery.      Physical Exam  Constitutional: She is oriented to person, place, and time. She appears well-developed and well-nourished.  Cardiovascular: Normal rate and regular rhythm.   Respiratory: Effort normal and breath sounds normal.  Genitourinary: Vagina normal.  Gravid   Neurological: She is alert and oriented to person, place, and time.  Psychiatric: She has a normal mood and affect. Her behavior is normal.    Prenatal labs: ABO, Rh: AB/Positive/-- (04/11 0000) Antibody: Negative (04/11 0000) Rubella: Immune (04/11 0000) RPR: Nonreactive (04/11 0000)  HBsAg: Negative (04/11 0000)  HIV: Non-reactive (04/11 0000)  GBS: Negative (07/02 0000)  One hour GCT 115 Too late for genetics screens  Assessment/Plan: Pt receiveing epidural, began to contract after SROM and very uncomfortable  Talmadge Ganas W 10/10/2012, 10:21 AM

## 2012-10-10 NOTE — MAU Note (Signed)
Pt states at 0715 had gush of fluid, clear, having irregular contractions. Was 2cm in office 1 week ago.

## 2012-10-10 NOTE — Anesthesia Preprocedure Evaluation (Signed)
Anesthesia Evaluation  Patient identified by MRN, date of birth, ID band Patient awake    Reviewed: Allergy & Precautions, H&P , NPO status , Patient's Chart, lab work & pertinent test results, reviewed documented beta blocker date and time   History of Anesthesia Complications Negative for: history of anesthetic complications  Airway Mallampati: I TM Distance: >3 FB Neck ROM: full    Dental  (+) Teeth Intact   Pulmonary neg pulmonary ROS,  breath sounds clear to auscultation        Cardiovascular negative cardio ROS  Rhythm:regular Rate:Normal     Neuro/Psych negative neurological ROS  negative psych ROS   GI/Hepatic negative GI ROS, Neg liver ROS,   Endo/Other  negative endocrine ROS  Renal/GU negative Renal ROS  negative genitourinary   Musculoskeletal   Abdominal   Peds  Hematology  (+) Blood dyscrasia (plt 121), anemia ,   Anesthesia Other Findings   Reproductive/Obstetrics (+) Pregnancy                           Anesthesia Physical Anesthesia Plan  ASA: II  Anesthesia Plan: Epidural   Post-op Pain Management:    Induction:   Airway Management Planned:   Additional Equipment:   Intra-op Plan:   Post-operative Plan:   Informed Consent: I have reviewed the patients History and Physical, chart, labs and discussed the procedure including the risks, benefits and alternatives for the proposed anesthesia with the patient or authorized representative who has indicated his/her understanding and acceptance.     Plan Discussed with:   Anesthesia Plan Comments:         Anesthesia Quick Evaluation

## 2012-10-11 LAB — CBC
HCT: 23.9 % — ABNORMAL LOW (ref 36.0–46.0)
MCH: 26.7 pg (ref 26.0–34.0)
MCV: 83 fL (ref 78.0–100.0)
Platelets: 89 10*3/uL — ABNORMAL LOW (ref 150–400)
RBC: 2.88 MIL/uL — ABNORMAL LOW (ref 3.87–5.11)
WBC: 8.5 10*3/uL (ref 4.0–10.5)

## 2012-10-11 NOTE — Progress Notes (Addendum)
Post Partum Day 1 Subjective: no complaints, up ad lib and tolerating PO, able to ambulate without dizziness, despite relative anemia--bleeding stable  Objective: Blood pressure 105/73, pulse 92, temperature 98.3 F (36.8 C), temperature source Oral, resp. rate 18, height 5\' 2"  (1.575 m), weight 62.596 kg (138 lb), last menstrual period 01/14/2012, SpO2 98.00%, unknown if currently breastfeeding.  Physical Exam:  General: alert and cooperative Lochia: appropriate Uterine Fundus: firm    Recent Labs  10/10/12 1712 10/11/12 0605  HGB 8.7* 7.7*  HCT 27.1* 23.9*    Assessment/Plan: Plan for discharge tomorrow Plateleats 89K--stable  Was 95K prior to delivery   LOS: 1 day   Ndidi Nesby W 10/11/2012, 8:49 AM

## 2012-10-11 NOTE — Progress Notes (Signed)
CSW referral received to assess reason for "[facility] @ 27 weeks."  Per hospital policy, [facility] is considered 28 weeks or greater.  Drug screen was not order.  CSW reviewed pt's chart & did not note other social concerns.  CSW intervention was not provided & therefore signing off.

## 2012-10-11 NOTE — Anesthesia Postprocedure Evaluation (Signed)
  Anesthesia Post-op Note  Patient: Debbie Soto  Procedure(s) Performed: * No procedures listed *  Patient Location: Mother/Baby  Anesthesia Type:Epidural  Level of Consciousness: awake  Airway and Oxygen Therapy: Patient Spontanous Breathing  Post-op Pain: mild  Post-op Assessment: Patient's Cardiovascular Status Stable and Respiratory Function Stable  Post-op Vital Signs: stable  Complications: No apparent anesthesia complications

## 2012-10-11 NOTE — Progress Notes (Signed)
UR chart review completed.  

## 2012-10-12 LAB — CBC
HCT: 25.3 % — ABNORMAL LOW (ref 36.0–46.0)
Hemoglobin: 8.1 g/dL — ABNORMAL LOW (ref 12.0–15.0)
MCH: 26.8 pg (ref 26.0–34.0)
MCHC: 32 g/dL (ref 30.0–36.0)
MCV: 83.8 fL (ref 78.0–100.0)
RDW: 14.1 % (ref 11.5–15.5)

## 2012-10-12 MED ORDER — OXYCODONE-ACETAMINOPHEN 5-325 MG PO TABS: 1.0000 | ORAL_TABLET | ORAL | Status: AC | PRN

## 2012-10-12 MED ORDER — IBUPROFEN 600 MG PO TABS: 600.0000 mg | ORAL_TABLET | Freq: Four times a day (QID) | ORAL | Status: AC

## 2012-10-12 NOTE — Progress Notes (Signed)
PPD #2 Feels ok, ambulating Afeb, VSS Fundus firm Hgb and platelets stable/improved D/c home

## 2012-10-12 NOTE — Discharge Summary (Signed)
Obstetric Discharge Summary Reason for Admission: rupture of membranes Prenatal Procedures: none Intrapartum Procedures: vacuum Postpartum Procedures: none Complications-Operative and Postpartum: 2nd degree perineal laceration and vaginal sulcus lac Hemoglobin  Date Value Range Status  10/12/2012 8.1* 12.0 - 15.0 g/dL Final     HCT  Date Value Range Status  10/12/2012 25.3* 36.0 - 46.0 % Final    Physical Exam:  General: alert Lochia: appropriate Uterine Fundus: firm  Discharge Diagnoses: Term Pregnancy-delivered and postpartum anemia  Discharge Information: Date: 10/12/2012 Activity: pelvic rest Diet: routine Medications: Ibuprofen and Percocet Condition: stable Instructions: refer to practice specific booklet Discharge to: home Follow-up Information   Follow up with [name], MD. Schedule an appointment as soon as possible for a visit in 6 weeks.   Contact information:   [address], [address] [place] [address] [phone]       Newborn Data: Live born female  Birth Weight: 7 lb 13.8 oz (3565 g) APGAR: 8, 8  Home with mother.  Hank Walling D 10/12/2012, 10:06 AM

## 2012-10-15 ENCOUNTER — Inpatient Hospital Stay (HOSPITAL_COMMUNITY): Admission: RE | Admit: 2012-10-15 | Payer: 59 | Source: Ambulatory Visit

## 2013-02-06 ENCOUNTER — Other Ambulatory Visit: Payer: Self-pay

## 2013-12-14 IMAGING — US US OB COMP LESS 14 WK
1 series · 14 of 18 positions shown · non-contrast
Comparison: None.

CLINICAL DATA: Abdominal pain and cramping.

OBSTETRIC <14 WK US AND TRANSVAGINAL OB US
TECHNIQUE: Both transabdominal and transvaginal ultrasound
examinations were performed for complete evaluation of the
gestation as well as the maternal uterus, adnexal regions, and
pelvic cul-de-sac.  Transvaginal technique was performed to assess
early pregnancy.

[Series 1: us ob comp less 14 wks · 18 acquisitions, 14 frames shown]
[im 1/18]
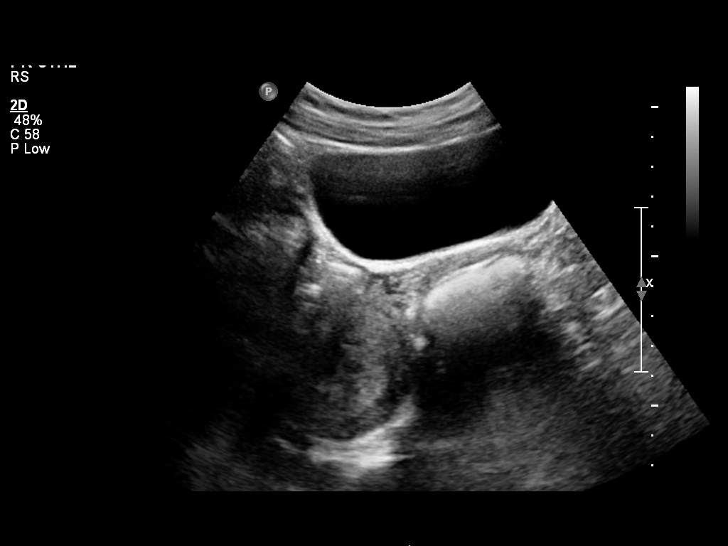
[im 2/18]
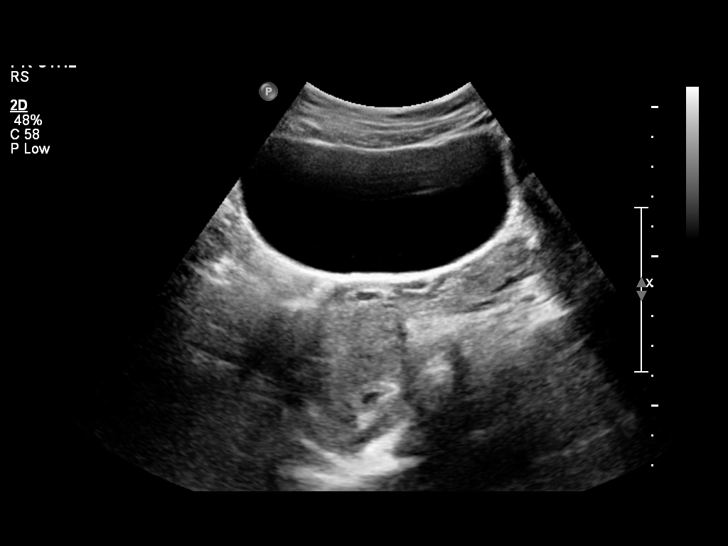
[im 4/18]
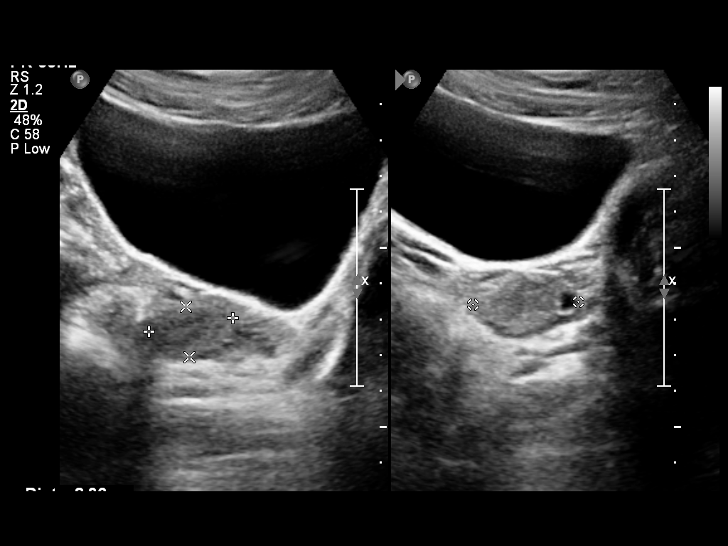
[im 5/18]
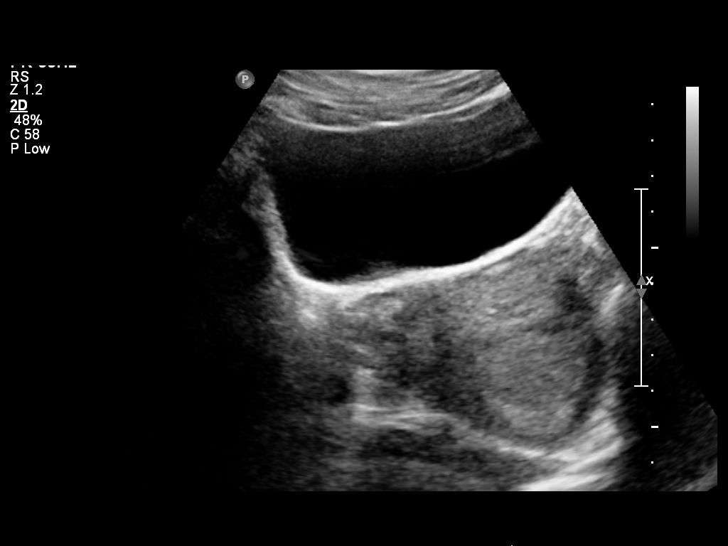
[im 6/18]
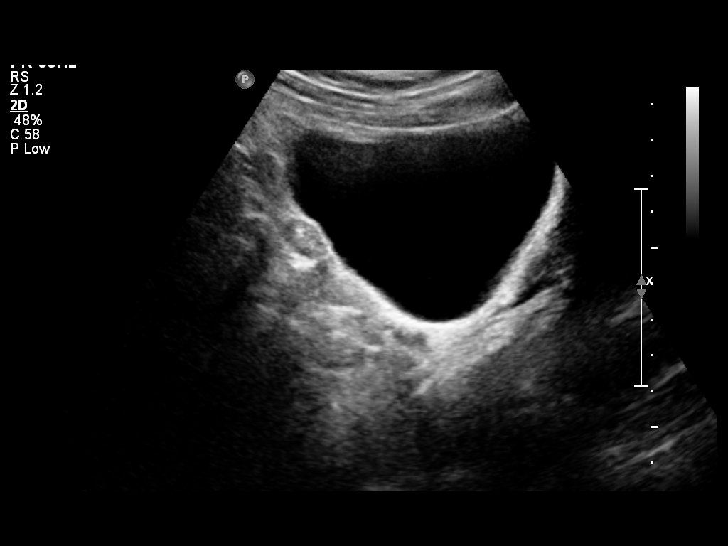
[im 8/18]
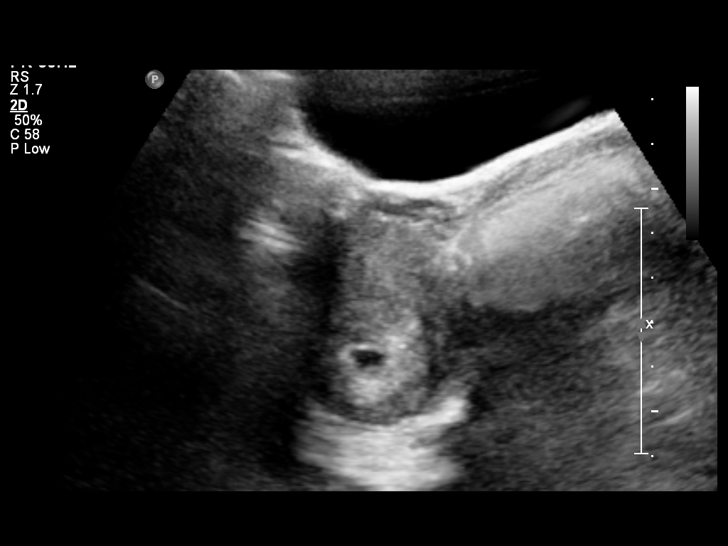
[im 9/18]
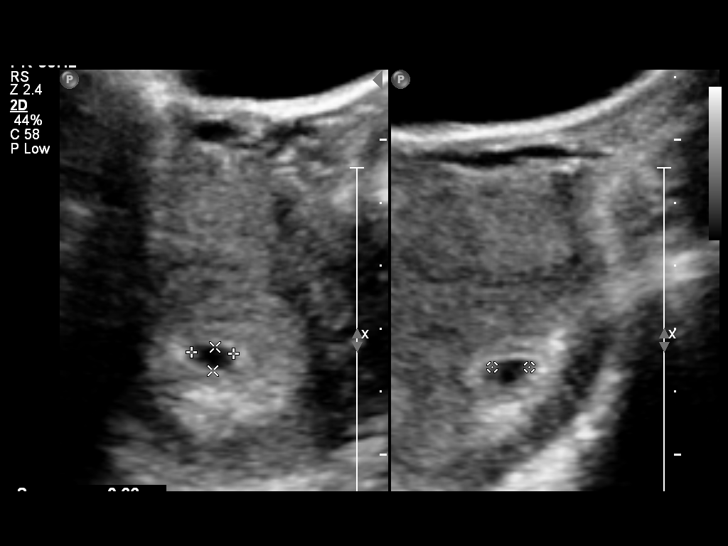
[im 10/18]
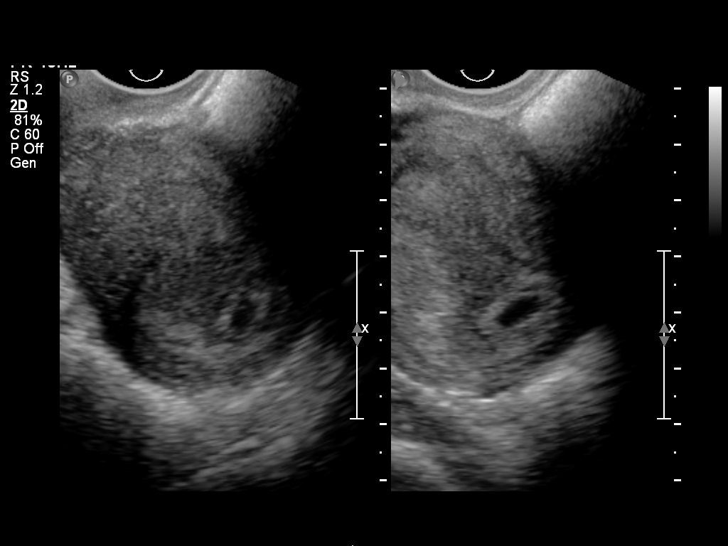
[im 11/18]
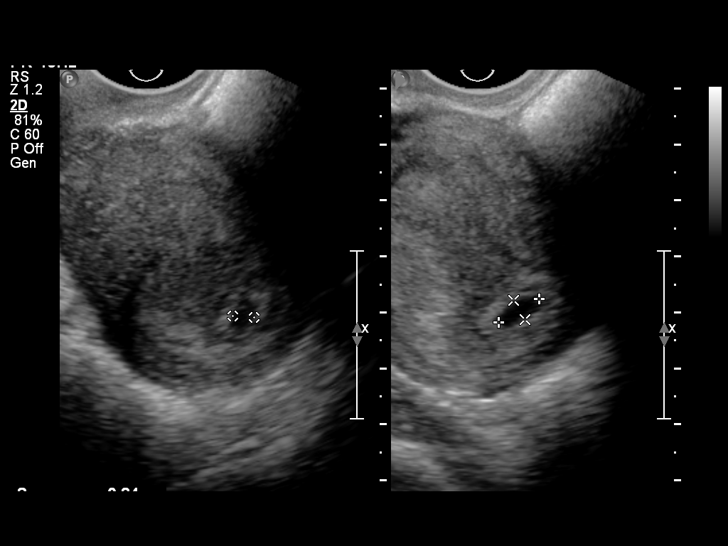
[im 13/18]
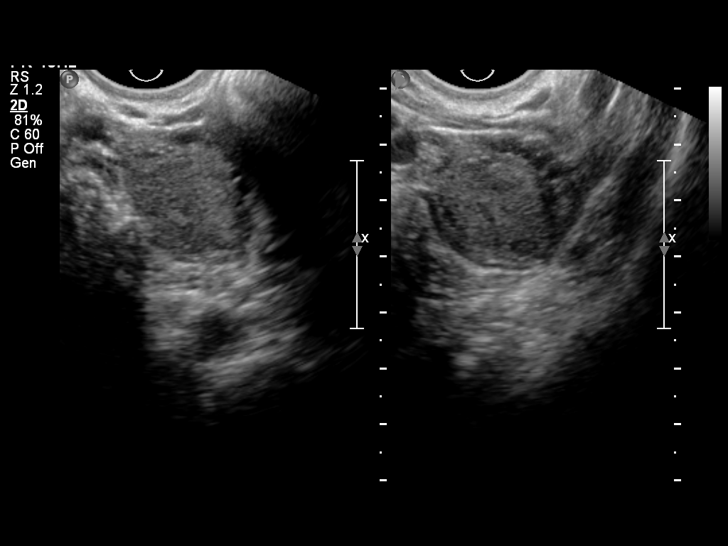
[im 14/18]
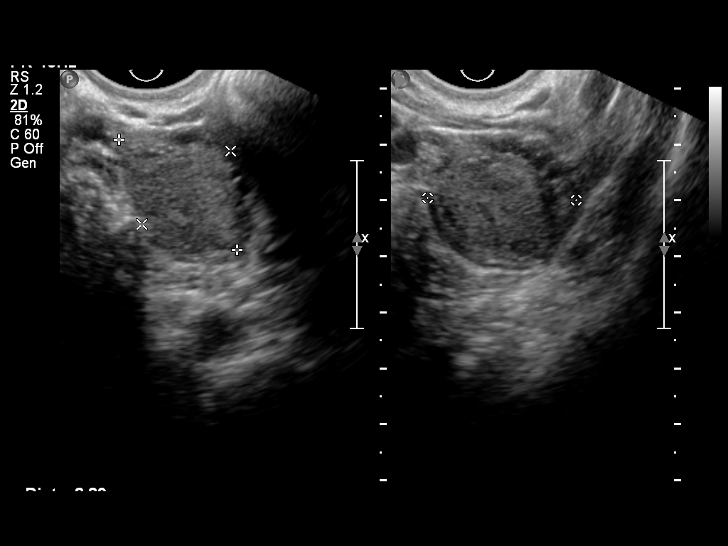
[im 15/18]
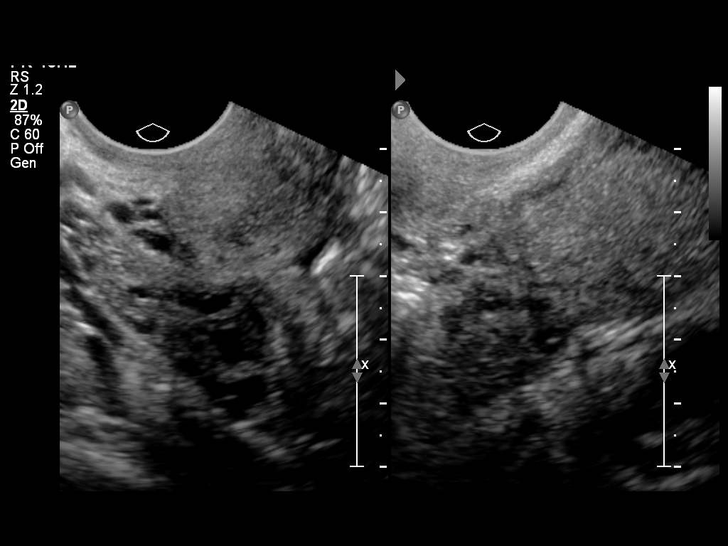
[im 17/18]
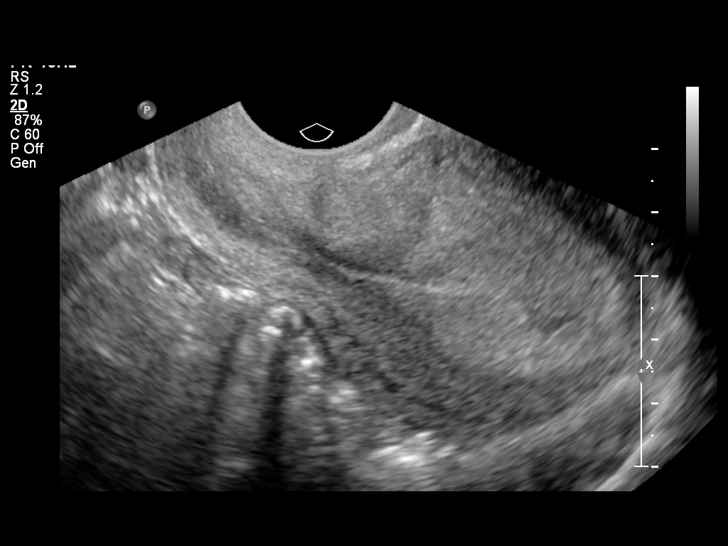
[im 18/18]
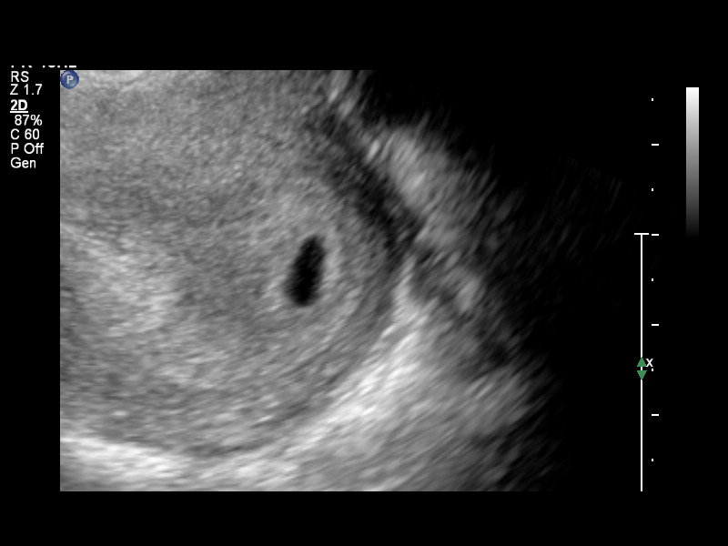

[14 of 18 positions shown; findings below may reference images not displayed]

Intrauterine gestational sac:  Visualized/normal in shape.
Yolk sac: No
Embryo: No
Cardiac Activity: N/A

MSD: 5.4 mm  5 w 1 d          US EDC: 10/10/2012

Maternal uterus/adnexae:
No subchorionic hemorrhage is noted.  The uterus is unremarkable in
appearance.

The ovaries are within normal limits.  The right ovary measures
x 1.8 x 2.1 cm, while the left ovary measures 2.9 x 2.1 x 2.7 cm.
No suspicious adnexal masses are seen; there is no evidence for
ovarian torsion.

No free fluid is seen within the pelvic cul-de-sac.
IMPRESSION: Single intrauterine gestational sac noted, with a mean sac diameter
of 5.4 mm, corresponding to a gestational age of 5 weeks 1 day.
This does not match the gestational age by LMP, reflecting a new
estimated date of delivery October 10, 2012.

## 2013-12-24 IMAGING — US US OB TRANSVAGINAL
1 series · 14 of 15 positions shown · non-contrast
Comparison: none

[Series 1: us ob transvaginal · 14 of 15 slices shown]
[im 1/15]
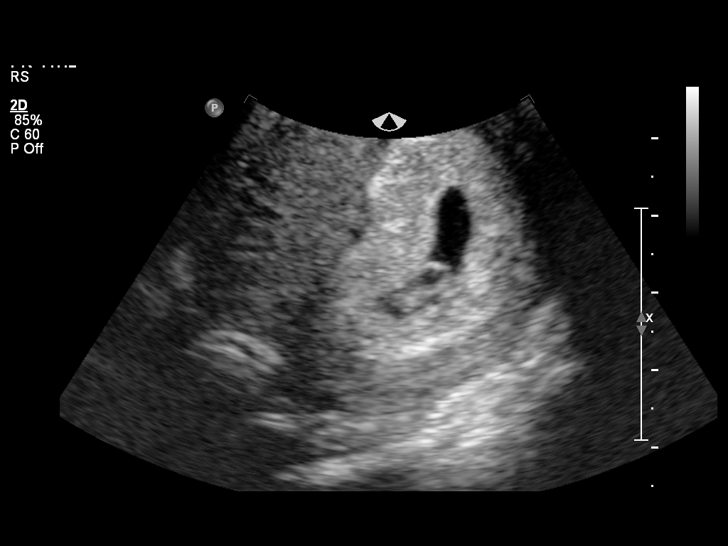
[im 2/15]
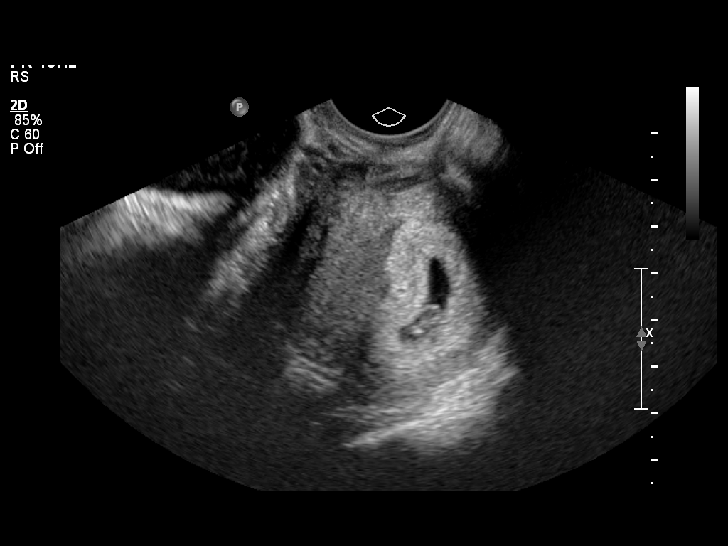
[im 3/15]
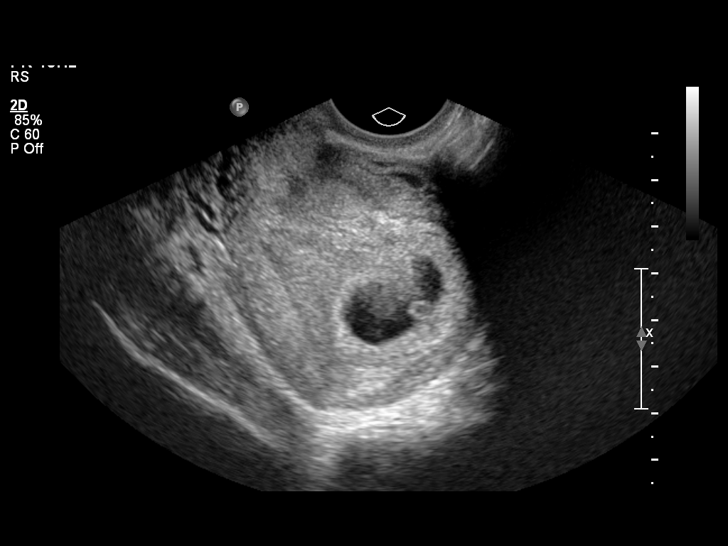
[im 4/15]
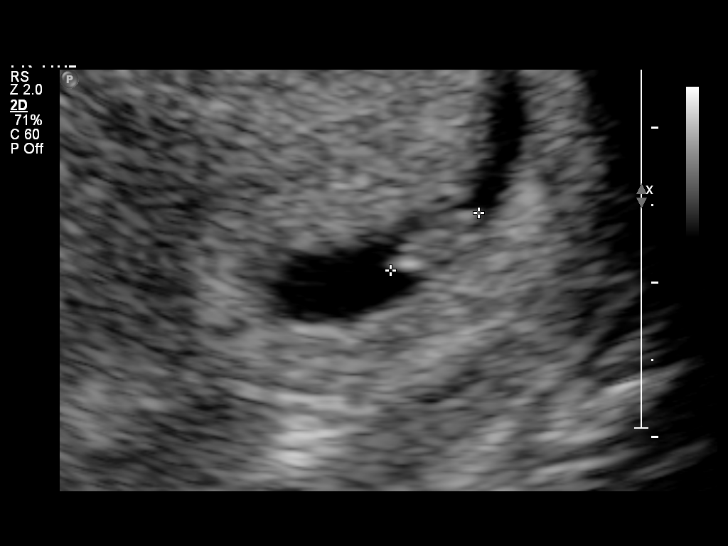
[im 5/15]
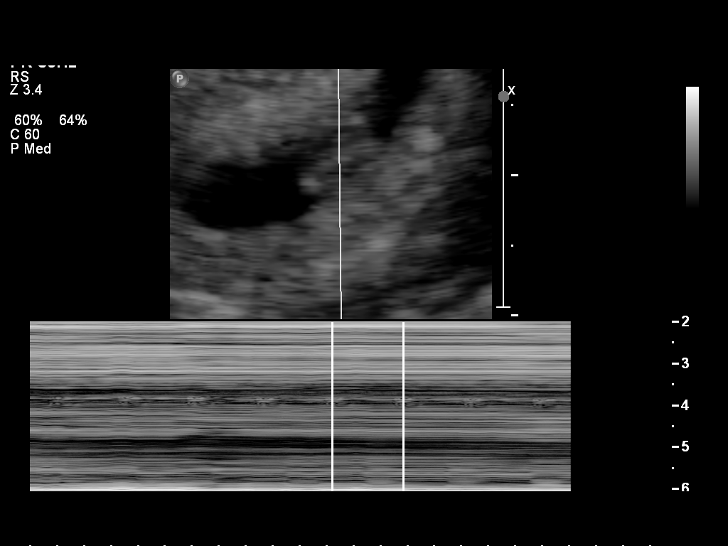
[im 6/15]
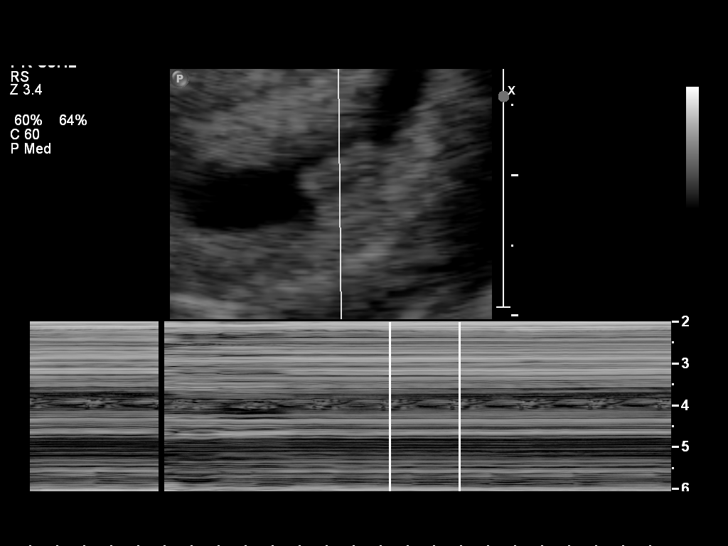
[im 7/15]
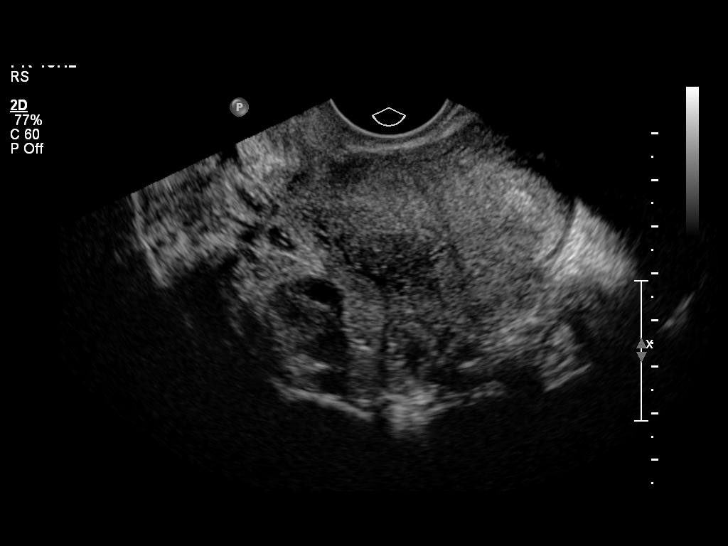
[im 9/15]
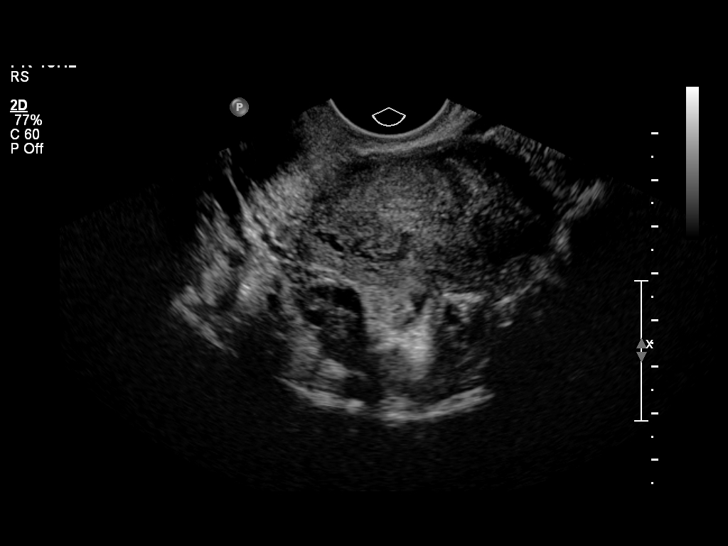
[im 10/15]
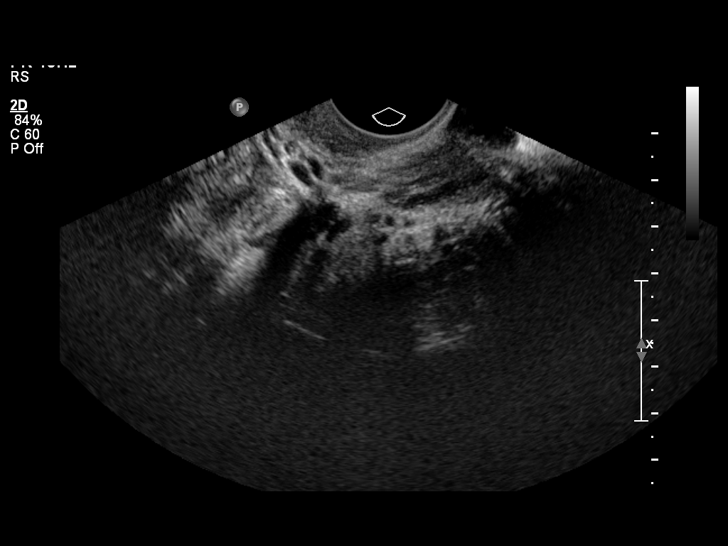
[im 11/15]
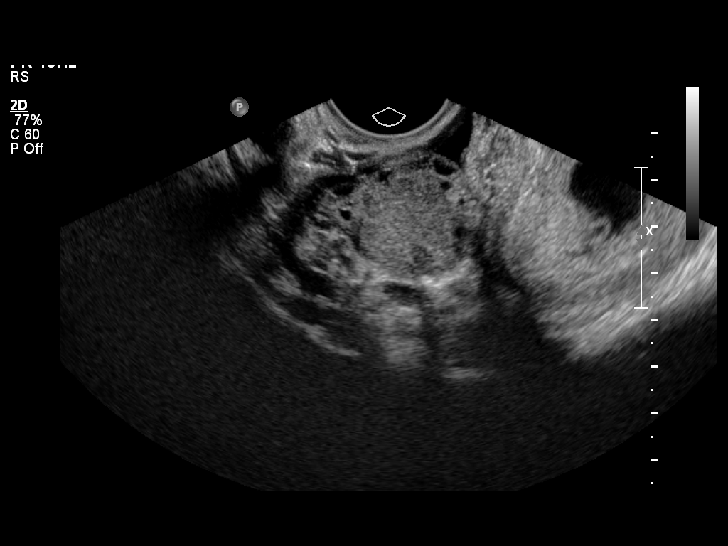
[im 12/15]
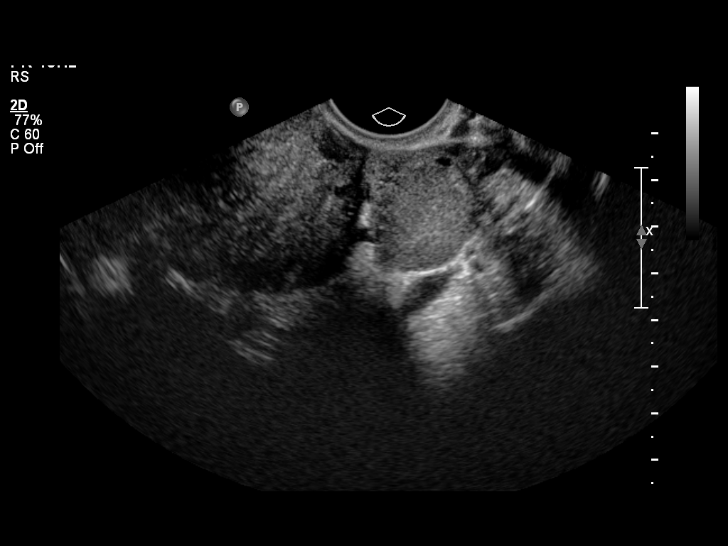
[im 13/15]
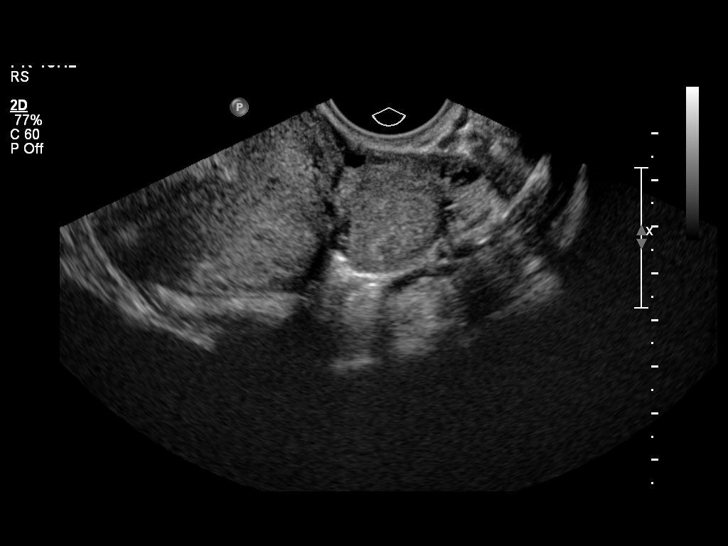
[im 14/15]
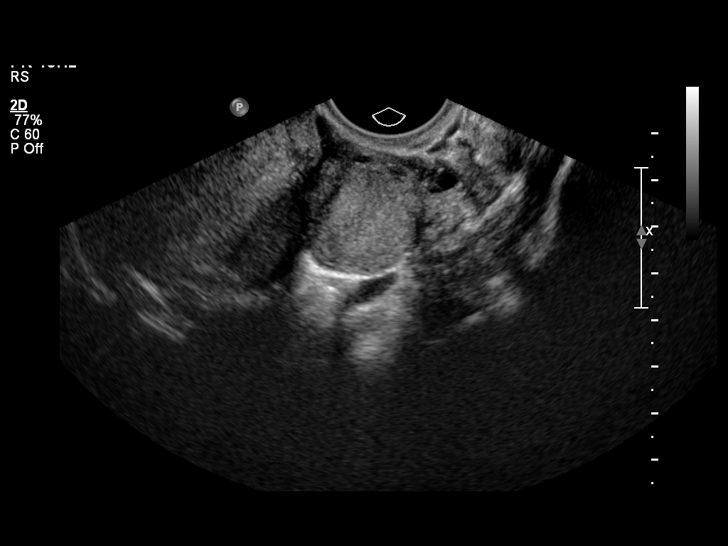
[im 15/15]
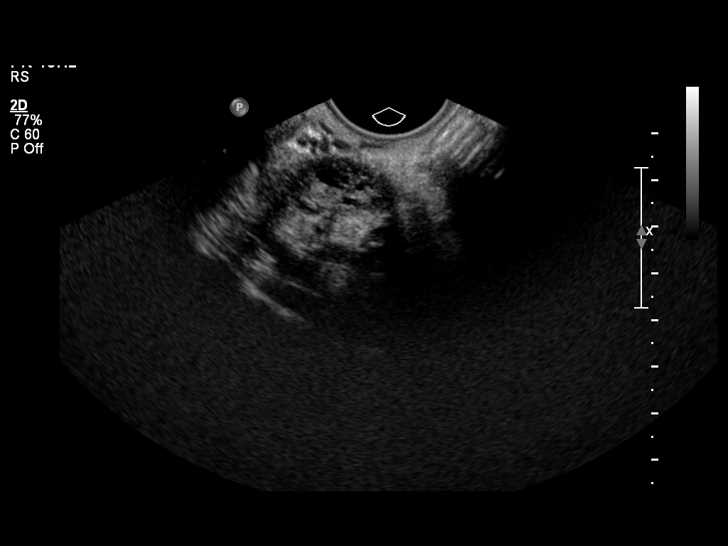

[14 of 15 positions shown; findings below may reference images not displayed]

OBSTETRICS REPORT
                      (Signed Final 02/19/2012 [DATE])

Service(s) Provided

 US OB TRANSVAGINAL                                    76817.0
Indications

 Pregnancy with inconclusive fetal viability
Fetal Evaluation

 Num Of Fetuses:    1
 Gest. Sac:         Intrauterine
 Yolk Sac:          Visualized
 Fetal Pole:        Visualized
 Fetal Heart Rate:  116                         bpm
 Cardiac Activity:  Observed

 Amniotic Fluid
 AFI FV:      Subjectively within normal limits
Biometry

 CRL:      6.8  mm    G. Age:   6w 4d                  EDD:   10/10/12
Gestational Age

 Best:          6w 4d     Det. By:   U/S C R L (02/19/12)     EDD:   10/10/12
Cervix Uterus Adnexa

 Cervix:       Normal appearance by transvaginal scan
 Uterus:       Retroverted.

 Left Ovary:   Within normal limits.
 Right Ovary:  Within normal limits.
 Adnexa:     No abnormality visualized.
Impression

 Single living IUP with US Gest. Age of 6w 4d, and EDD of
 10/10/2012.  Appropriate interval growth.
 No significant maternal uterine or adnexal abnormality
 identified.
 questions or concerns.

## 2014-02-02 ENCOUNTER — Encounter (HOSPITAL_COMMUNITY): Payer: Self-pay

## 2018-09-19 ENCOUNTER — Ambulatory Visit (INDEPENDENT_AMBULATORY_CARE_PROVIDER_SITE_OTHER): Payer: Self-pay | Admitting: Podiatry

## 2018-09-19 DIAGNOSIS — Z5329 Procedure and treatment not carried out because of patient's decision for other reasons: Secondary | ICD-10-CM

## 2018-09-19 NOTE — Progress Notes (Signed)
Same day no show. 

## 2022-09-07 DIAGNOSIS — Z8742 Personal history of other diseases of the female genital tract: Secondary | ICD-10-CM | POA: Diagnosis not present

## 2022-09-07 DIAGNOSIS — R87810 Cervical high risk human papillomavirus (HPV) DNA test positive: Secondary | ICD-10-CM | POA: Diagnosis not present

## 2022-09-07 DIAGNOSIS — Z01419 Encounter for gynecological examination (general) (routine) without abnormal findings: Secondary | ICD-10-CM | POA: Diagnosis not present

## 2022-09-07 DIAGNOSIS — Z30431 Encounter for routine checking of intrauterine contraceptive device: Secondary | ICD-10-CM | POA: Diagnosis not present

## 2022-09-07 DIAGNOSIS — Z1389 Encounter for screening for other disorder: Secondary | ICD-10-CM | POA: Diagnosis not present

## 2022-11-28 DIAGNOSIS — Z3202 Encounter for pregnancy test, result negative: Secondary | ICD-10-CM | POA: Diagnosis not present

## 2022-11-28 DIAGNOSIS — N879 Dysplasia of cervix uteri, unspecified: Secondary | ICD-10-CM | POA: Diagnosis not present
